# Patient Record
Sex: Female | Born: 1935 | Race: White | Hispanic: No
Health system: Southern US, Community
[De-identification: ages and names within clinical notes are randomized; demographics above are authoritative.]

---

## 2003-12-14 ENCOUNTER — Other Ambulatory Visit: Payer: Self-pay

## 2004-08-05 ENCOUNTER — Ambulatory Visit: Payer: Self-pay | Admitting: Oncology

## 2004-09-05 ENCOUNTER — Ambulatory Visit: Payer: Self-pay | Admitting: Oncology

## 2004-10-05 ENCOUNTER — Ambulatory Visit: Payer: Self-pay | Admitting: Oncology

## 2004-11-07 ENCOUNTER — Ambulatory Visit: Payer: Self-pay | Admitting: Oncology

## 2004-12-06 ENCOUNTER — Ambulatory Visit: Payer: Self-pay | Admitting: Oncology

## 2005-01-03 ENCOUNTER — Ambulatory Visit: Payer: Self-pay | Admitting: Oncology

## 2005-02-03 ENCOUNTER — Ambulatory Visit: Payer: Self-pay | Admitting: Oncology

## 2005-03-05 ENCOUNTER — Ambulatory Visit: Payer: Self-pay | Admitting: Oncology

## 2005-03-23 ENCOUNTER — Ambulatory Visit: Payer: Self-pay | Admitting: Oncology

## 2005-04-05 ENCOUNTER — Ambulatory Visit: Payer: Self-pay | Admitting: Oncology

## 2005-05-05 ENCOUNTER — Ambulatory Visit: Payer: Self-pay | Admitting: Oncology

## 2005-06-05 ENCOUNTER — Ambulatory Visit: Payer: Self-pay | Admitting: Oncology

## 2005-07-06 ENCOUNTER — Ambulatory Visit: Payer: Self-pay | Admitting: Oncology

## 2005-08-05 ENCOUNTER — Ambulatory Visit: Payer: Self-pay | Admitting: Oncology

## 2005-09-05 ENCOUNTER — Ambulatory Visit: Payer: Self-pay | Admitting: Oncology

## 2005-10-05 ENCOUNTER — Ambulatory Visit: Payer: Self-pay | Admitting: Oncology

## 2005-11-05 ENCOUNTER — Ambulatory Visit: Payer: Self-pay | Admitting: Oncology

## 2005-12-06 ENCOUNTER — Ambulatory Visit: Payer: Self-pay | Admitting: Oncology

## 2006-01-03 ENCOUNTER — Ambulatory Visit: Payer: Self-pay | Admitting: Oncology

## 2006-02-03 ENCOUNTER — Ambulatory Visit: Payer: Self-pay | Admitting: Oncology

## 2006-02-15 ENCOUNTER — Ambulatory Visit: Payer: Self-pay | Admitting: Oncology

## 2006-03-05 ENCOUNTER — Ambulatory Visit: Payer: Self-pay | Admitting: Oncology

## 2006-04-05 ENCOUNTER — Ambulatory Visit: Payer: Self-pay | Admitting: Oncology

## 2006-05-05 ENCOUNTER — Ambulatory Visit: Payer: Self-pay | Admitting: Oncology

## 2006-06-05 ENCOUNTER — Ambulatory Visit: Payer: Self-pay | Admitting: Oncology

## 2006-06-25 ENCOUNTER — Ambulatory Visit: Payer: Self-pay | Admitting: Ophthalmology

## 2006-07-06 ENCOUNTER — Ambulatory Visit: Payer: Self-pay | Admitting: Oncology

## 2006-08-05 ENCOUNTER — Ambulatory Visit: Payer: Self-pay | Admitting: Oncology

## 2006-09-05 ENCOUNTER — Ambulatory Visit: Payer: Self-pay | Admitting: Oncology

## 2006-10-05 ENCOUNTER — Ambulatory Visit: Payer: Self-pay | Admitting: Oncology

## 2006-11-05 ENCOUNTER — Ambulatory Visit: Payer: Self-pay | Admitting: Oncology

## 2006-12-06 ENCOUNTER — Ambulatory Visit: Payer: Self-pay | Admitting: Oncology

## 2007-01-04 ENCOUNTER — Ambulatory Visit: Payer: Self-pay | Admitting: Oncology

## 2007-01-07 ENCOUNTER — Ambulatory Visit: Payer: Self-pay | Admitting: Oncology

## 2007-02-04 ENCOUNTER — Ambulatory Visit: Payer: Self-pay | Admitting: Oncology

## 2007-03-06 ENCOUNTER — Ambulatory Visit: Payer: Self-pay | Admitting: Oncology

## 2007-04-06 ENCOUNTER — Ambulatory Visit: Payer: Self-pay | Admitting: Oncology

## 2007-05-06 ENCOUNTER — Ambulatory Visit: Payer: Self-pay | Admitting: Oncology

## 2007-06-06 ENCOUNTER — Ambulatory Visit: Payer: Self-pay | Admitting: Oncology

## 2007-06-13 ENCOUNTER — Ambulatory Visit: Payer: Self-pay | Admitting: Oncology

## 2007-07-01 ENCOUNTER — Ambulatory Visit: Payer: Self-pay | Admitting: Oncology

## 2007-07-07 ENCOUNTER — Ambulatory Visit: Payer: Self-pay | Admitting: Oncology

## 2007-08-06 ENCOUNTER — Ambulatory Visit: Payer: Self-pay | Admitting: Oncology

## 2007-09-06 ENCOUNTER — Ambulatory Visit: Payer: Self-pay | Admitting: Oncology

## 2007-10-06 ENCOUNTER — Ambulatory Visit: Payer: Self-pay | Admitting: Oncology

## 2007-11-06 ENCOUNTER — Ambulatory Visit: Payer: Self-pay | Admitting: Oncology

## 2007-11-25 ENCOUNTER — Ambulatory Visit: Payer: Self-pay | Admitting: Oncology

## 2007-12-07 ENCOUNTER — Ambulatory Visit: Payer: Self-pay | Admitting: Oncology

## 2007-12-26 ENCOUNTER — Ambulatory Visit: Payer: Self-pay | Admitting: Family Medicine

## 2008-01-04 ENCOUNTER — Ambulatory Visit: Payer: Self-pay | Admitting: Oncology

## 2008-02-04 ENCOUNTER — Ambulatory Visit: Payer: Self-pay | Admitting: Oncology

## 2008-04-05 ENCOUNTER — Ambulatory Visit: Payer: Self-pay | Admitting: Oncology

## 2008-04-06 ENCOUNTER — Ambulatory Visit: Payer: Self-pay | Admitting: Oncology

## 2008-05-05 ENCOUNTER — Ambulatory Visit: Payer: Self-pay | Admitting: Oncology

## 2008-06-05 ENCOUNTER — Ambulatory Visit: Payer: Self-pay | Admitting: Oncology

## 2008-07-06 ENCOUNTER — Ambulatory Visit: Payer: Self-pay | Admitting: Oncology

## 2008-08-05 ENCOUNTER — Ambulatory Visit: Payer: Self-pay | Admitting: Oncology

## 2008-09-05 ENCOUNTER — Ambulatory Visit: Payer: Self-pay | Admitting: Oncology

## 2008-09-21 ENCOUNTER — Ambulatory Visit: Payer: Self-pay | Admitting: Oncology

## 2008-10-05 ENCOUNTER — Ambulatory Visit: Payer: Self-pay | Admitting: Oncology

## 2008-12-14 ENCOUNTER — Ambulatory Visit: Payer: Self-pay | Admitting: Oncology

## 2009-01-03 ENCOUNTER — Ambulatory Visit: Payer: Self-pay | Admitting: Oncology

## 2009-01-22 ENCOUNTER — Emergency Department: Payer: Self-pay | Admitting: Emergency Medicine

## 2009-02-07 ENCOUNTER — Ambulatory Visit: Payer: Self-pay | Admitting: Oncology

## 2009-05-05 ENCOUNTER — Ambulatory Visit: Payer: Self-pay | Admitting: Oncology

## 2009-05-18 ENCOUNTER — Ambulatory Visit: Payer: Self-pay | Admitting: Oncology

## 2009-05-31 ENCOUNTER — Ambulatory Visit: Payer: Self-pay | Admitting: Oncology

## 2009-06-05 ENCOUNTER — Ambulatory Visit: Payer: Self-pay | Admitting: Oncology

## 2009-08-05 ENCOUNTER — Ambulatory Visit: Payer: Self-pay | Admitting: Oncology

## 2009-08-31 ENCOUNTER — Inpatient Hospital Stay: Payer: Self-pay | Admitting: Internal Medicine

## 2009-09-05 ENCOUNTER — Ambulatory Visit: Payer: Self-pay | Admitting: Oncology

## 2009-10-05 ENCOUNTER — Ambulatory Visit: Payer: Self-pay | Admitting: General Surgery

## 2009-11-05 ENCOUNTER — Ambulatory Visit: Payer: Self-pay | Admitting: Oncology

## 2009-11-29 ENCOUNTER — Ambulatory Visit: Payer: Self-pay | Admitting: Oncology

## 2009-12-06 ENCOUNTER — Ambulatory Visit: Payer: Self-pay | Admitting: Oncology

## 2010-05-05 ENCOUNTER — Ambulatory Visit: Payer: Self-pay | Admitting: Oncology

## 2010-05-26 ENCOUNTER — Ambulatory Visit: Payer: Self-pay | Admitting: Oncology

## 2010-05-30 ENCOUNTER — Ambulatory Visit: Payer: Self-pay | Admitting: Oncology

## 2010-06-05 ENCOUNTER — Ambulatory Visit: Payer: Self-pay | Admitting: Oncology

## 2010-11-15 ENCOUNTER — Ambulatory Visit: Payer: Self-pay | Admitting: Oncology

## 2010-11-16 LAB — CANCER ANTIGEN 27.29: CA 27.29: 19.2 U/mL (ref 0.0–38.6)

## 2010-12-06 ENCOUNTER — Ambulatory Visit: Payer: Self-pay | Admitting: Oncology

## 2011-05-31 ENCOUNTER — Ambulatory Visit: Payer: Self-pay | Admitting: Oncology

## 2011-06-06 ENCOUNTER — Ambulatory Visit: Payer: Self-pay | Admitting: Oncology

## 2011-06-07 LAB — CANCER ANTIGEN 27.29: CA 27.29: 23.6 U/mL (ref 0.0–38.6)

## 2011-07-07 ENCOUNTER — Ambulatory Visit: Payer: Self-pay | Admitting: Oncology

## 2011-12-28 ENCOUNTER — Ambulatory Visit: Payer: Self-pay | Admitting: Oncology

## 2011-12-28 LAB — COMPREHENSIVE METABOLIC PANEL
Alkaline Phosphatase: 49 U/L — ABNORMAL LOW (ref 50–136)
BUN: 16 mg/dL (ref 7–18)
Bilirubin,Total: 0.7 mg/dL (ref 0.2–1.0)
Co2: 29 mmol/L (ref 21–32)
Creatinine: 1.5 mg/dL — ABNORMAL HIGH (ref 0.60–1.30)
EGFR (African American): 43 — ABNORMAL LOW
EGFR (Non-African Amer.): 35 — ABNORMAL LOW
Glucose: 116 mg/dL — ABNORMAL HIGH (ref 65–99)
SGOT(AST): 26 U/L (ref 15–37)
SGPT (ALT): 22 U/L
Sodium: 139 mmol/L (ref 136–145)
Total Protein: 9.8 g/dL — ABNORMAL HIGH (ref 6.4–8.2)

## 2011-12-28 LAB — CBC CANCER CENTER
Basophil #: 0 x10 3/mm (ref 0.0–0.1)
Eosinophil #: 0.1 x10 3/mm (ref 0.0–0.7)
HCT: 36.4 % (ref 35.0–47.0)
Lymphocyte %: 36.8 %
MCV: 95.9 fL (ref 80–100)
Monocyte %: 9.8 %
Neutrophil #: 2.4 x10 3/mm (ref 1.4–6.5)
Neutrophil %: 51.6 %
Platelet: 185 x10 3/mm (ref 150–440)
RBC: 3.8 10*6/uL (ref 3.80–5.20)
RDW: 13.1 % (ref 11.5–14.5)
WBC: 4.8 x10 3/mm (ref 3.6–11.0)

## 2011-12-29 LAB — CANCER ANTIGEN 27.29: CA 27.29: 18.9 U/mL (ref 0.0–38.6)

## 2011-12-31 LAB — PROT IMMUNOELECTROPHORES(ARMC)

## 2012-01-04 ENCOUNTER — Ambulatory Visit: Payer: Self-pay | Admitting: Oncology

## 2012-01-18 ENCOUNTER — Ambulatory Visit: Payer: Self-pay | Admitting: Neurology

## 2012-04-16 IMAGING — US US EXTREM LOW BILAT COMPLETE
1 series · 17 of 25 positions shown · non-contrast
Comparison: none

REASON FOR EXAM: swelling pain eval DVT
COMMENTS:

[Series 1: us extrem low bilat complete · 17 of 85 slices shown]
[im 1/85]
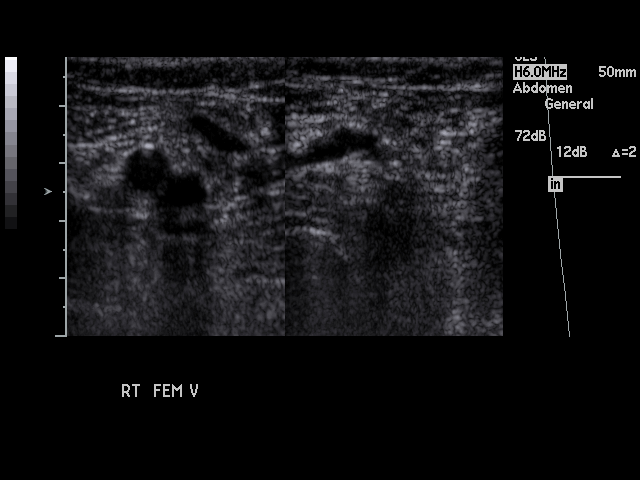
[im 8/85]
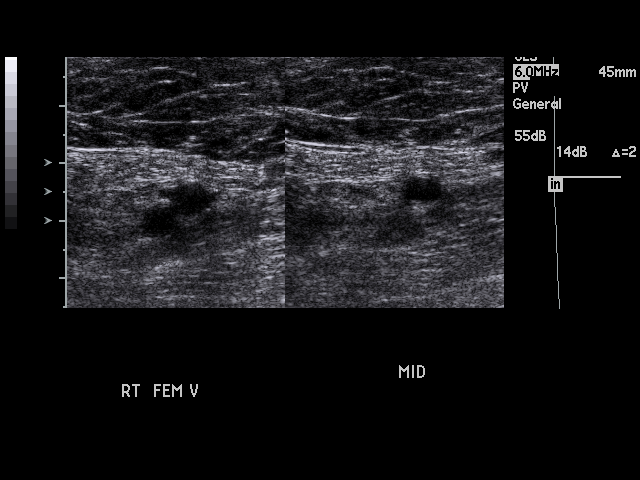
[im 11/85]
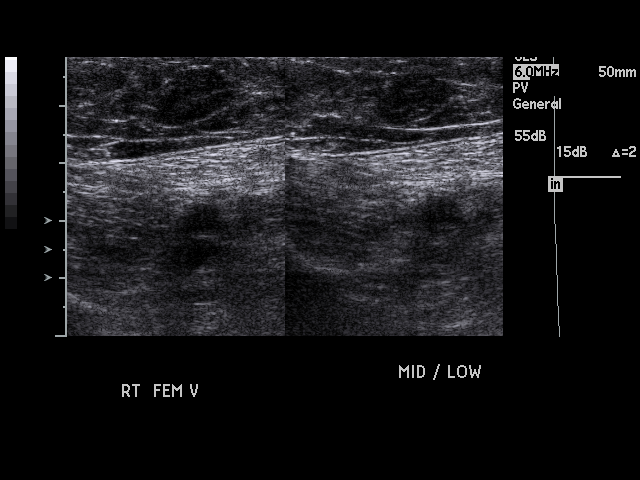
[im 18/85]
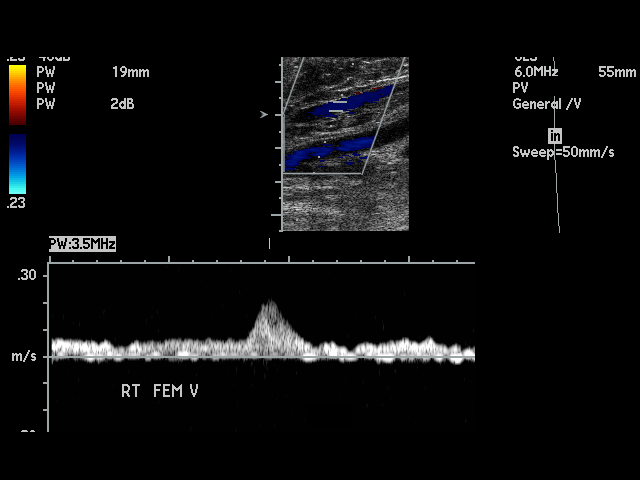
[im 22/85]
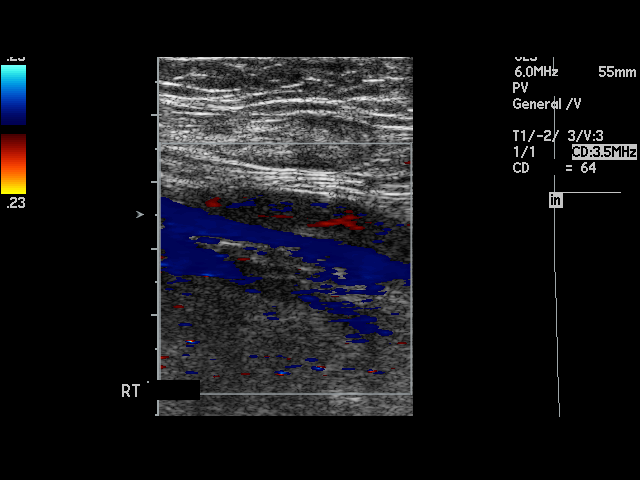
[im 29/85]
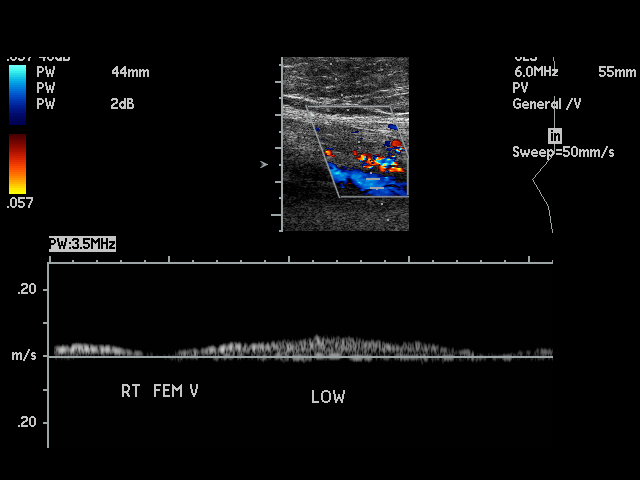
[im 32/85]
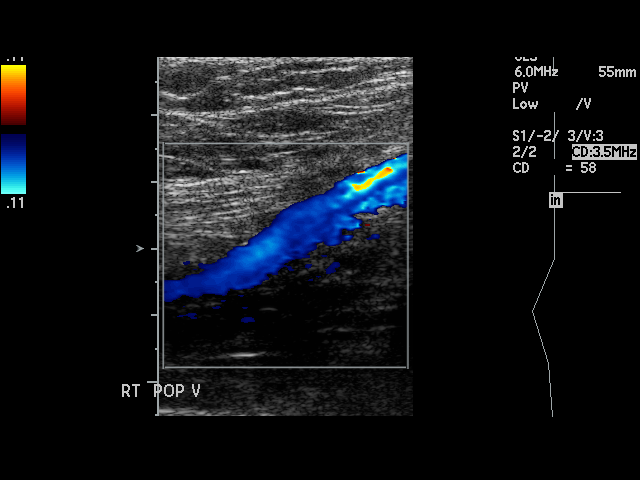
[im 39/85]
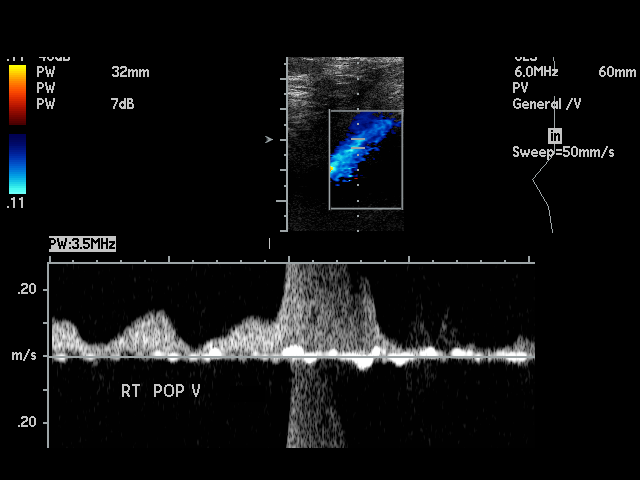
[im 43/85]
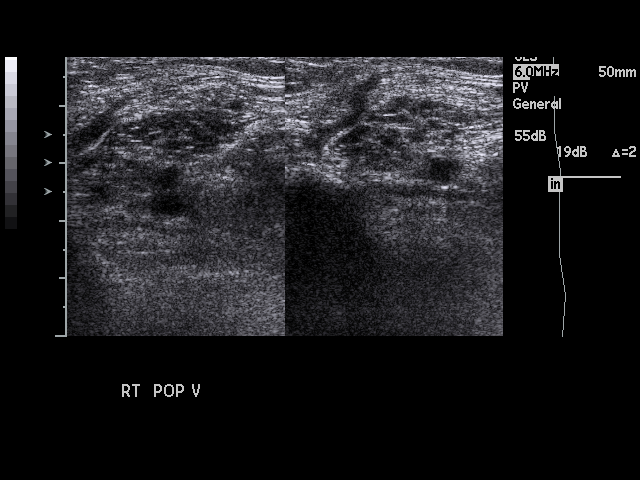
[im 46/85]
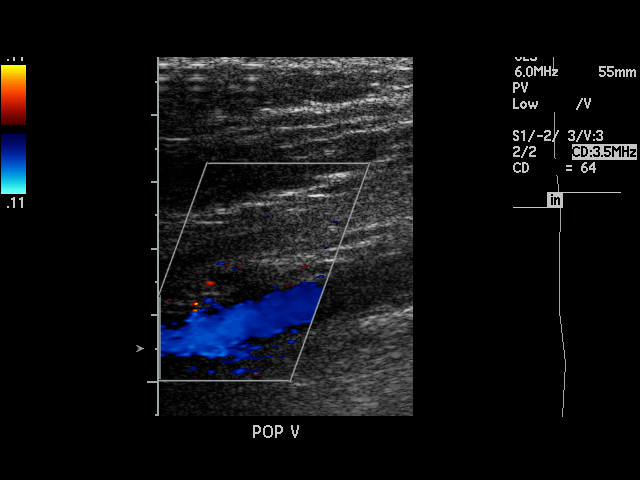
[im 53/85]
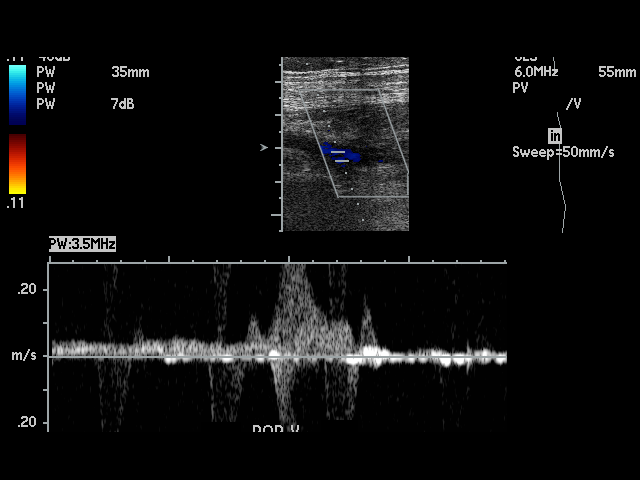
[im 57/85]
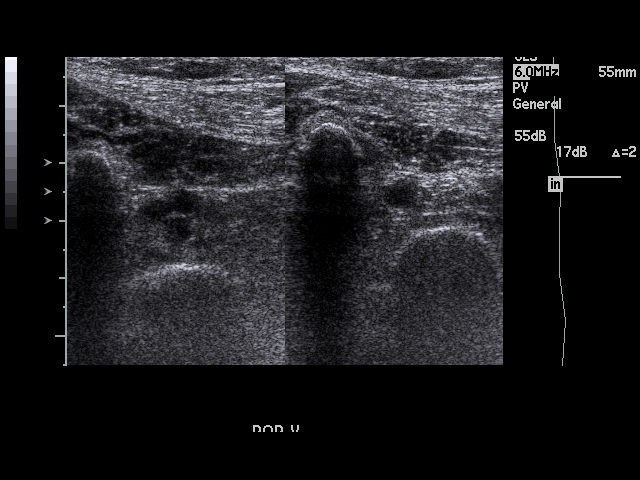
[im 64/85]
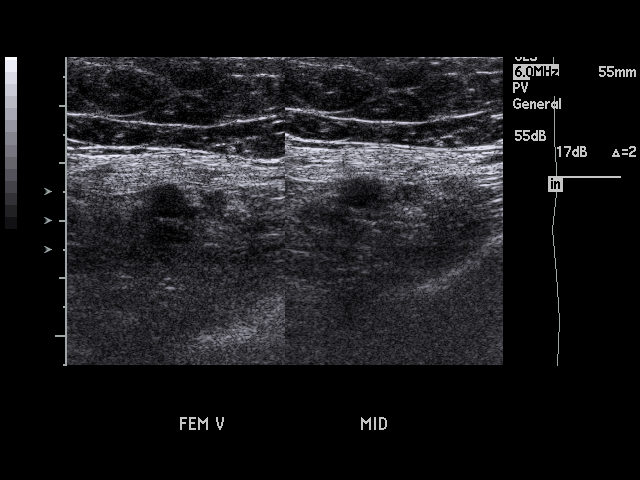
[im 67/85]
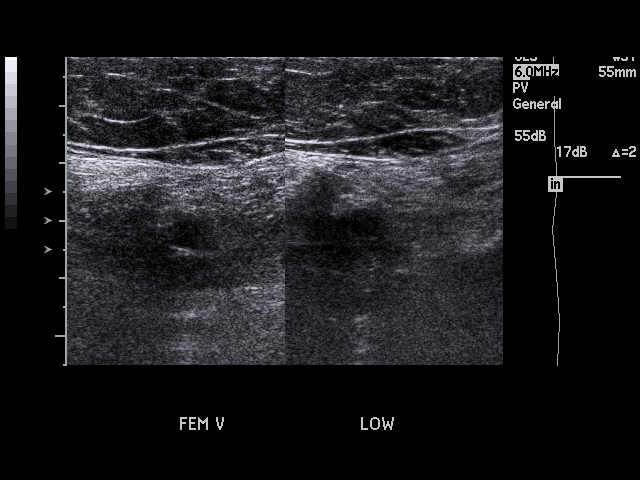
[im 74/85]
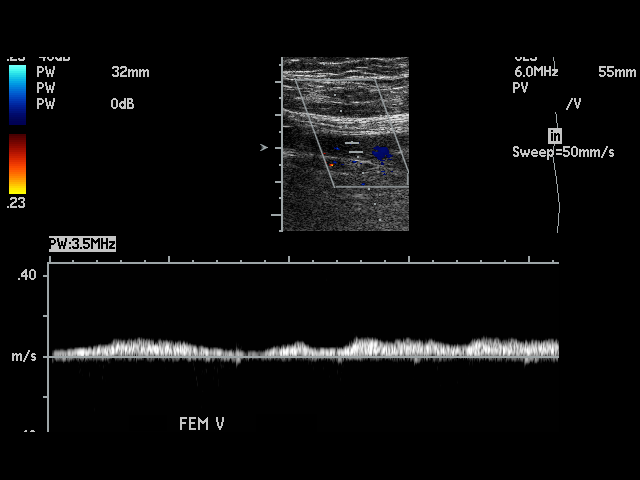
[im 78/85]
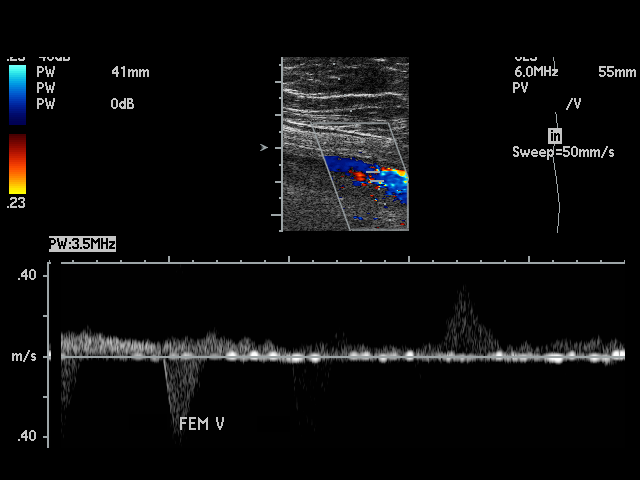
[im 85/85]
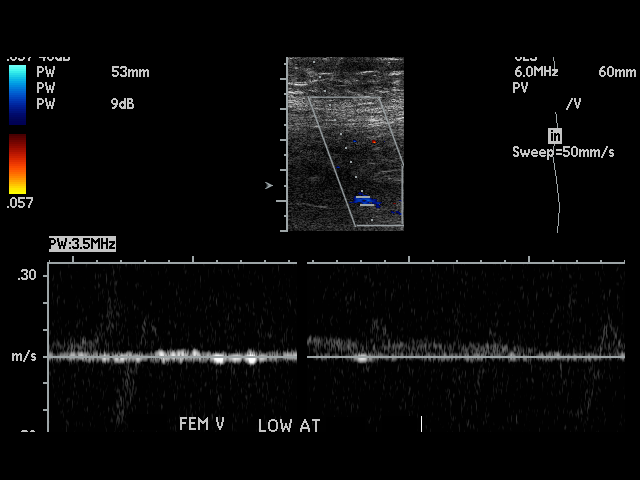

[17 of 25 positions shown; findings below may reference images not displayed]

PROCEDURE:     REDDI - REDDI DOPPLER LOW EXTR BILATERAL  - June 12, 2011 [DATE]

RESULT:     Duplex Doppler interrogation of the deep venous system of both
legs from the inguinal to the popliteal region demonstrates the deep venous
systems are fully compressible throughout. The color Doppler and spectral
Doppler appearance is normal. There is normal response to distal
augmentation. The color Doppler images show no filling defect.
IMPRESSION: 1. No evidence of DVT in either lower extremity.

## 2012-06-03 ENCOUNTER — Ambulatory Visit: Payer: Self-pay | Admitting: Oncology

## 2012-07-04 ENCOUNTER — Ambulatory Visit: Payer: Self-pay | Admitting: Oncology

## 2012-07-04 LAB — COMPREHENSIVE METABOLIC PANEL
Alkaline Phosphatase: 61 U/L (ref 50–136)
BUN: 21 mg/dL — ABNORMAL HIGH (ref 7–18)
Bilirubin,Total: 0.6 mg/dL (ref 0.2–1.0)
Creatinine: 1.21 mg/dL (ref 0.60–1.30)
Glucose: 117 mg/dL — ABNORMAL HIGH (ref 65–99)
SGPT (ALT): 20 U/L (ref 12–78)
Sodium: 137 mmol/L (ref 136–145)
Total Protein: 9 g/dL — ABNORMAL HIGH (ref 6.4–8.2)

## 2012-07-04 LAB — CBC CANCER CENTER
Basophil #: 0.1 x10 3/mm (ref 0.0–0.1)
Eosinophil #: 0.1 x10 3/mm (ref 0.0–0.7)
Eosinophil %: 1.4 %
HCT: 36.5 % (ref 35.0–47.0)
Lymphocyte #: 1.9 x10 3/mm (ref 1.0–3.6)
MCV: 96 fL (ref 80–100)
Monocyte %: 11 %
Neutrophil #: 3.2 x10 3/mm (ref 1.4–6.5)
Neutrophil %: 54.9 %
Platelet: 201 x10 3/mm (ref 150–440)
RBC: 3.81 10*6/uL (ref 3.80–5.20)
WBC: 5.8 x10 3/mm (ref 3.6–11.0)

## 2012-07-06 ENCOUNTER — Ambulatory Visit: Payer: Self-pay | Admitting: Oncology

## 2014-09-14 ENCOUNTER — Ambulatory Visit: Payer: Self-pay | Admitting: Emergency Medicine

## 2014-09-14 LAB — COMPREHENSIVE METABOLIC PANEL
Albumin: 3.2 g/dL — ABNORMAL LOW (ref 3.4–5.0)
Alkaline Phosphatase: 95 U/L
Anion Gap: 8 (ref 7–16)
BUN: 28 mg/dL — ABNORMAL HIGH (ref 7–18)
Bilirubin,Total: 0.7 mg/dL (ref 0.2–1.0)
Calcium, Total: 9.1 mg/dL (ref 8.5–10.1)
Chloride: 101 mmol/L (ref 98–107)
Co2: 24 mmol/L (ref 21–32)
Creatinine: 1.8 mg/dL — ABNORMAL HIGH (ref 0.60–1.30)
EGFR (African American): 34 — ABNORMAL LOW
EGFR (Non-African Amer.): 28 — ABNORMAL LOW
Glucose: 80 mg/dL (ref 65–99)
Osmolality: 271 (ref 275–301)
Potassium: 5.2 mmol/L — ABNORMAL HIGH (ref 3.5–5.1)
SGOT(AST): 30 U/L (ref 15–37)
SGPT (ALT): 25 U/L
Sodium: 133 mmol/L — ABNORMAL LOW (ref 136–145)
Total Protein: 10.5 g/dL — ABNORMAL HIGH (ref 6.4–8.2)

## 2014-09-14 LAB — CBC WITH DIFFERENTIAL/PLATELET
Basophil #: 0 10*3/uL (ref 0.0–0.1)
Basophil %: 0.5 %
Eosinophil #: 0.1 10*3/uL (ref 0.0–0.7)
Eosinophil %: 1.8 %
HCT: 39.9 % (ref 35.0–47.0)
HGB: 12.9 g/dL (ref 12.0–16.0)
Lymphocyte #: 2.4 10*3/uL (ref 1.0–3.6)
Lymphocyte %: 38.1 %
MCH: 29 pg (ref 26.0–34.0)
MCHC: 32.4 g/dL (ref 32.0–36.0)
MCV: 90 fL (ref 80–100)
Monocyte #: 0.5 x10 3/mm (ref 0.2–0.9)
Monocyte %: 8.4 %
Neutrophil #: 3.2 10*3/uL (ref 1.4–6.5)
Neutrophil %: 51.2 %
Platelet: 257 10*3/uL (ref 150–440)
RBC: 4.45 10*6/uL (ref 3.80–5.20)
RDW: 16 % — ABNORMAL HIGH (ref 11.5–14.5)
WBC: 6.2 10*3/uL (ref 3.6–11.0)

## 2015-01-02 ENCOUNTER — Inpatient Hospital Stay: Payer: Self-pay | Admitting: Internal Medicine

## 2015-01-04 ENCOUNTER — Ambulatory Visit
Admit: 2015-01-04 | Disposition: A | Discharge status: Home / Self Care | Payer: Self-pay | Attending: Internal Medicine | Admitting: Internal Medicine

## 2015-02-04 ENCOUNTER — Ambulatory Visit
Admit: 2015-02-04 | Disposition: A | Discharge status: Home / Self Care | Payer: Self-pay | Attending: Internal Medicine | Admitting: Internal Medicine

## 2015-02-04 DEATH — deceased

## 2015-03-06 NOTE — Consult Note (Signed)
Psychiatry: Follow-up for this patient with dementia and agitation now admitted to the hospital for medical stabilization.  The patient was unable to provide any history or review of systems.  Nursing at bedside reports that she has been delirious and not making any sense although she has been able to drink some fluids. review of systems possible. mental status the patient is quite confused.  No idea where she is.  Tells me she is 81 years old and the year is [id number].  Affect flat and confused.  Delirious. as needed doses of antipsychotic for agitation.  No other intervention indicated at this point.  We will follow as needed.  Electronic Signatures: Cydne Grahn, Jackquline DenmarkJohn T (MD)  (Signed on 29-Feb-16 15:14)  Authored  Last Updated: 29-Feb-16 15:14 by Audery Amellapacs, Milanna Kozlov T (MD)

## 2015-03-06 NOTE — Discharge Summary (Signed)
PATIENT NAME:  Maria Maynard, Maria Maynard MR#:  [id number] DATE OF BIRTH:  1925/03/02  DATE OF ADMISSION:  01/02/2015 DATE OF DISCHARGE:  01/05/2015  DISCHARGE DIAGNOSES: 1.  Acute hypoxia, probably secondary to acute bronchiolitis.  2.  Acute kidney injury.  3.  Hypothyroidism with low TSH.  4.  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   CONSULTATIONS: Pulmonology, psychiatry, palliative care.   PROCEDURES: None.   BRIEF HISTORY AND PHYSICAL AND HOSPITAL COURSE: The patient is an 81 year old female brought into the ED with shortness of breath. Please review history and physical for more details. The patient has history of Alzheimer dementia,brought into the hospital on 12/30/2014 for aggressive behavior given her dementia. On 01/02/2015 with this current admission, the patient was found to be hypoxemic with pulse oximetry at 70% on room air.   HOSPITAL COURSE BASED ON THE PROBLEM: Acute  Hypoxia :  probably from acute bronchiolitis per CT chest with underlying pleural metastasis and probably mucus plugging. The patient was placed on nonrebreather and we could not wean him off. The patient was seen by pulmonology who has recommended to transfer the patient to ICU, as the patient is still short of breath and has very low threshold for intubation. This was discussed with the family members, including son who is the medical power of attorney. After explaining the clinical situation, son has requested only comfort care measures with hospice. The patient was given antibiotics, IV Rocephin and Zithromax, but as per the patient's son's request his IV antibiotics were continued and palliative care consult was placed. CAT scan of the chest did not reveal any pulmonary embolus. The patient's nonrebreather was discontinued and the patient was placed on oxygen via nasal cannula. Strict comfort care measures were implemented. Subsequently, the patient was then transferred to hospice home,  after seen and evaluated by palliative care on March 2.   Acute kidney injury, resolved with IV fluids.   Hypothyroidism with low TSH. Free T4 was elevated. Decreased home dose Synthroid to 37.5 mcg from 50 mcg.   Alzheimer dementia with aggressive behavior. Psychiatric consult was placed, but even before psychiatry has evaluated her, the patient was made comfort care and she was transferred to hospice home on 01/05/2015.   CODE STATUS AT THE TIME OF TRANSFER: DNR with comfort care measures.   DISCHARGE PHYSICAL EXAMINATION: VITAL SIGNS: On March 2 were not recorded as the patient was made comfort care. On March 1, pulse 76, respirations 18, blood pressure 147/80 with pulse oximetry 95%.   GENERAL APPEARANCE: The patient was found to be comfortable and he was on comfort care measures, pink conjunctivae.  NECK: Supple.  RESPIRATORY: Using accessory muscles. Positive rhonchi and crackles.  GASTROINTESTINAL: Soft. Normal bowel sounds.  EXTREMITIES: There is no cyanosis, clubbing.  SKIN: With no rashes.  PSYCHIATRIC: The patient was found to be lethargic.   LABORATORY AND IMAGING STUDIES: February 29: Glucose 95, BUN 24, creatinine 1.04. Sodium and potassium were normal. Chloride 110, CO2 of 24. Anion gap 7. Serum osmolality and calcium were normal. GFR greater than 60. WBC 8.3, hemoglobin 11.3, hematocrit 34.7, platelets are 154,000.   MEDICATIONS AT THE TIME OF TRANSFER: Levothyroxine 50 mcg 1 tablet p.o. once daily, lorazepam 0.5 mg 1 to 2 tablets p.o. every 2 to 4 hours as needed for agitation or anxiety, morphine 20 mg/mL 0.25 mL p.o. every 1 to 2 hours for comfort.  DIET: As tolerated.   ACTIVITY: As tolerated.   OXYGEN: As needed for comfort.  FOLEY: Also, will leave Foley catheter for urinary incontinence and to prevent skin breakdown.  The plan of care was discussed in detail with the patient's son, who is the medical power of attorney. He is aware of the plan and all his  questions were answered.   TOTAL TIME SPENT ON THE DISCHARGE AND COORDINATION OF CARE: Forty-five minutes.    ____________________________ Ramonita LabAruna Elyanna Wallick, MD ag:TM D: 01/09/2015 23:02:00 ET T: 01/10/2015 00:32:29 ET JOB#: [id number]  cc: Ramonita LabAruna Sheray Grist, MD, <Dictator> Ramonita LabARUNA Seanne Chirico MD ELECTRONICALLY SIGNED 01/18/2015 20:46

## 2015-03-06 NOTE — Consult Note (Signed)
PATIENT NAME:  Maria PickleMITCHELL, Peightyn N MR#:  [id number] DATE OF BIRTH:  Nov 16, 1924  DATE OF CONSULTATION:  01/02/2015  REFERRING PHYSICIAN:   CONSULTING PHYSICIAN:  Audyn Dimercurio K. Guss Bundehalla, MD  SUBJECTIVE: Staff reports that the patient was agitated in the Emergency Room, and they were concerned and asked for a psychiatry consult. The patient has a long history of dementia and has been living at a facility. According to the nurse that is following her, the patient had been calm and cooperative and she is on the mask with oxygen going on and oxygen is 95% saturated and she is much calmer, cooperative.  OBJECTIVE: The patient was seen lying in bed with oxygen going on. Alert but is very confused. Rambling speech, did not make much sense and it is mostly nonsensical. When nurse was pointed out and she was asked who she is, she said "It is a thing." Does not make much sense. Insight, judgment impaired.  IMPRESSION: Major neuro cognitive disorder - Alzheimer dementia with confusion.  RECOMMENDATION: Continue current treatment. Consider Ativan 1 mg p.o. or IM q. 6 hours p.r.n. for agitation and Haldol 1 mg p.o. or IM q. 6 hours for p.r.n. agitation.   ____________________________ Jannet MantisSurya K. Guss Bundehalla, MD skc:ST D: 01/02/2015 19:11:11 ET T: 01/03/2015 01:53:32 ET JOB#: [id number]  cc: Monika SalkSurya K. Guss Bundehalla, MD, <Dictator> Beau FannySURYA K Henderson Frampton MD ELECTRONICALLY SIGNED 01/03/2015 7:47

## 2015-03-06 NOTE — H&P (Signed)
PATIENT NAME:  Maria Maynard, Maria Maynard MR#:  [id number] DATE OF BIRTH:  1924-12-01  DATE OF ADMISSION:  01/02/2015  REFERRING PHYSICIAN: Coolidge Breeze, MD    PRIMARY CARE PHYSICIAN: Mickie Hillier. Sheppard Penton, MD   CHIEF COMPLAINT: Shortness of breath.   HISTORY OF PRESENT ILLNESS: This is an 81 year old Caucasian female with past medical history of breast cancer with known pleural metastases; essential hypertension, hypothyroidism, unspecified; as well as Alzheimer dementia who was originally brought to the hospital on 12/30/2014 for aggressive behavior given her dementia. She was then evaluated in the Emergency Department throughout that time period by both psychiatry and case management, essentially working on finding her an appropriate place to reside, as the family states they are no longer able to care for her. Today she had an acute episode where she became hypoxic, desaturates into the high 70s on room air. Had improvement of her oxygen saturation with some supplemental oxygen. No further symptomatology noted. The patient herself unable to provide any meaningful information given mental status and medical condition. Her mental status is at baseline, per the Emergency Room staff. Family members were unable to be reached at this time through the given contact number of [phone].    REVIEW OF SYSTEMS: Unobtainable given the patient's mental status and medical condition.   PAST MEDICAL HISTORY: Includes breast cancer with known pleural metastases; as well as essential hypertension; hypothyroidism, unspecified; Alzheimer dementia with aggressive behavior.   SOCIAL HISTORY: Unobtainable given the patient's mental status and medical condition. Per documentation, currently resides with her son. No history of alcohol, tobacco, or drug usage.   FAMILY HISTORY: Unobtainable, given the patient's mental status and medical condition.   ALLERGIES: No known drug allergies.   HOME MEDICATIONS: Include metoprolol  50 mg p.o. b.i.d., rivastigmine 9.5 mg 24-hour  transdermal film 1/2 patch daily, melatonin 10 mg p.o. at bedtime, levothyroxine 50 mcg p.o. daily.   PHYSICAL EXAMINATION:  VITAL SIGNS: Temperature 99.2, heart rate of 111, respirations 20, blood pressure 127/60, currently saturating 97% on nonrebreather.  GENERAL: Weak, frail-appearing Caucasian female currently in minimal distress given respiratory status.  HEAD: Normocephalic, atraumatic.  EYES: Pupils equal, round, react to light. Unable to fully assess whether extraocular muscle intact. No scleral icterus.  MOUTH: Dry mucosal membrane. Dentition intact. No abscess noted.  EAR, NOSE, AND THROAT: Clear without exudates. No external lesions.  NECK: Supple. No thyromegaly. No nodules. No JVD.  PULMONARY: Diminished breath sounds secondary to poor respiratory effort with some coarse breath sounds throughout the right lung fields; however, no frank wheezes, rales, or rhonchi at this time. No use of accessory muscles. Poor respiratory effort, as stated above.  CHEST: Nontender to palpation.  CARDIOVASCULAR: S1, S2, tachycardic. No murmurs, rubs, or gallops. No edema. Pedal pulses 2+ bilaterally.  GASTROINTESTINAL: Soft, nontender, nondistended. No masses. Positive bowel sounds. No hepatosplenomegaly.  MUSCULOSKELETAL: No swelling, clubbing, or edema. Range of motion full in all extremities.  NEUROLOGIC: Cranial nerves II-XII intact. Unable to fully assess neurological status given her mental status at baseline, as she is unable to follow simple commands. However, does have spontaneous movement in all extremities.  SKIN: No ulceration, lesions, rashes, or cyanosis. Skin warm, dry. Turgor intact.  PSYCHIATRIC: Unable to fully assess given the patient's mental status and medical condition, as she is unable to follow simple commands or provide any meaningful information.   LABORATORY DATA: Sodium of 140, potassium 4, chloride 105, bicarbonate of 25, BUN  20, creatinine 1.32, glucose of 163. Troponin  less than 0.02. TSH of 0.046. WBC of 10.4, hemoglobin 13.4, platelets of 200,000. Urinalysis negative for evidence of infection. Chest x-ray performed, which reveals no active cardiopulmonary process. CT chest performed to rule out pulmonary embolus is negative for PE; however, does reveal bronchial thickening and findings consistent with bronchiolitis in the lower lobes and right middle lobe.   ASSESSMENT AND PLAN: An 81 year old Caucasian female with a history of dementia, as well as known breast cancer with pleural metastases who originally presented for behavior disturbances related to her Alzheimer dementia; however, throughout the course of her Emergency Department stay has developed a new onset hypoxia with increased oxygen demands.  1.  Hypoxia: No evidence of pulmonary embolus. CT findings suggestive of bronchiolitis. We will provide supplemental oxygen to keep oxygen saturation greater than 92%. DuoNeb treatments q. 4 hours as required for shortness of breath. We will initiate antibiotic coverage with azithromycin and ceftriaxone. Cultures have been obtained from the Emergency Room.  2.  Acute kidney injury: Intravenous fluid hydration. Follow urine output and renal function.  3.  Hypothyroidism with low TSH: We will check a free T4. Continue with home dosages of Synthroid.  4.  Alzheimer dementia with aggressive behavior: We will consult psychiatry as well as social work, as they were already following in the Emergency Room and continue with the search for placement.  5.  Venous thromboembolism prophylaxis: Heparin.   CODE STATUS: Unable to reach the family at this time at the given the numbers to determine code status.    TIME SPENT: 45 minutes.    ____________________________ Cletis Athensavid K. Hower, MD dkh:bm D: 01/02/2015 00:38:09 ET T: 01/02/2015 01:04:15 ET JOB#: [id number]  cc: Cletis Athensavid K. Hower, MD, <Dictator> DAVID Synetta ShadowK HOWER MD ELECTRONICALLY  SIGNED 01/03/2015 4:19

## 2015-03-06 NOTE — Consult Note (Signed)
PATIENT NAME:  Maria Maynard, [place] N MR#:  [id number] DATE OF BIRTH:  [birth year]  DATE OF CONSULTATION:  12/31/2014  REFERRING PHYSICIAN:   CONSULTING PHYSICIAN:  Audery AmelJohn T. Harshith Pursell, MD  IDENTIFYING INFORMATION AND REASON FOR CONSULTATION: An 81 year old woman with multiple medical problems, brought to the hospital by her family because of worsening behavior.   HISTORY OF PRESENT ILLNESS: Information obtained primarily from the chart and evaluation directly of the patient when possible. This is an 81 year old woman who was brought in by her family who stated that for about the last week and a half she has been more aggressive and combative with hitting, kicking and slapping at people. They state that these behaviors have happened in the past, but they seem to be getting worse. Report that it has been getting constant, one thing after another. It is not really clear that there has been any acute exacerbation here rather just a chronic problem that may have worsened a little bit to where they were fed up with it.  No indication that I can see of any new or different medicines. The patient is unable to give any information at all.   PAST PSYCHIATRIC HISTORY: Evidently has an established history of dementia. Had been in facilities in the past but had gotten thrown out for her aggressive behavior. No other known past psychiatric history.   SOCIAL HISTORY: Evidently had been living with her family recently. Again, the patient is not able to give information, sounds like she was living with her son.   PAST MEDICAL HISTORY: The patient suffers from breast cancer, but appears to be in long-term remission. History of hypothyroidism and high blood pressure as well.   FAMILY HISTORY: Unknown.   SUBSTANCE ABUSE HISTORY: Unknown.   REVIEW OF SYSTEMS: The patient is not able to offer any review of systems.   MENTAL STATUS EXAMINATION: Very elderly woman, looks her stated age, evaluated in the Emergency Room. The  patient was asleep, but would wake up with speaking her name. Eyes would open but she did not make direct eye contact. Psychomotor activity very slow and limited. Speech is quiet, often mumbled or whispered. Much decreased in amount. Affect is flat. Thoughts are disorganized. The patient clearly believes that she is somewhere else, is talking to people who are not there. Behavior does not show any aggression at this point, but she has been given medication in the Emergency Room. Not able to reasonably ask questions about suicidal or homicidal ideation, but not showing any acute aggressive behavior. The patient is not able to complete any kind of cognitive testing really. When I asked her where she was, she thought that she was in a garden place, also said that she was 81 years old. I did not pursue further cognitive evaluation.   LABORATORY RESULTS: Head CT was done and showed old infarct, chronic shrinking, but it did not sound like there was any change from the last exams that were done. Nothing to indicate any new mass. TSH has been checked and is very low at 0.046. Chemistry panel: Elevated AST at 39, elevated protein 9.8, otherwise pretty unremarkable. Alcohol, acetaminophen, and salicylates negative. CBC unremarkable. The urinalysis shows 1+ bacteria, 6 epithelial cells, 7 white cells, 2 red blood cells, trace leukocyte esterase. Drug screen positive for benzodiazepines.   CURRENT MEDICATIONS: Metoprolol 50 mg twice a day, levothyroxine 50 mcg 1 tablet once a day, melatonin 10 mg once a day, rivastigmine (Exelon) patch daily.   ALLERGIES: No known drug  allergies.   ASSESSMENT: An 81 year old woman who currently is delirious. Could have severely advanced dementia that could be complicated by medical issues. I discussed her urinalysis with the Emergency Room doctor who is of the opinion that it is unlikely to be a causative factor, but agrees to try antibiotics to see if that would improve her mental  state. The patient has been given some sedating medicine here in the Emergency Room and is not showing any aggression right now. No other obvious acute medical problem to cause this. Most likely primarily Alzheimer disease and vascular dementia with progressive behavior problems.   TREATMENT PLAN: The psychiatry staff will discuss the diagnosis and treatment plan with the family and encourage them to take her back home as this is unlikely to really need psychiatric hospitalization. I can start her on a very low dose of Risperdal 0.25 mg twice a day as needed for agitation. She will need to then follow up with a primary care doctor. If the family refuses to take her home we will have to step back and re-evaluate what can be done.   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   SECONDARY DIAGNOSES: AXIS I: No further.  AXIS II: Deferred.  AXIS III: History of breast cancer, hypertension, and hypothyroid with possible over medication right now. Rule out urinary tract infection.   ____________________________ Audery Amel, MD jtc:sb D: 12/31/2014 14:24:43 ET T: 12/31/2014 14:45:56 ET JOB#: [id number]  cc: Audery Amel, MD, <Dictator> Audery Amel MD ELECTRONICALLY SIGNED 01/11/2015 10:38

## 2015-11-06 IMAGING — CT CT ANGIO CHEST
2 of 6 series · 18 of 36 positions shown · IV contrast (APPLIED)
Comparison: Chest radiographs earlier this day.

CLINICAL DATA: Hypoxia, tachycardia, shortness of breath.

EXAM:
CT ANGIOGRAPHY CHEST WITH CONTRAST
TECHNIQUE: Multidetector CT imaging of the chest was performed using the
standard protocol during bolus administration of intravenous
contrast. Multiplanar CT image reconstructions and MIPs were
obtained to evaluate the vascular anatomy.
CONTRAST:  75 mL Omnipaque 350 IV.

[Series 5: pe 1.0 thins · axial · 0.64mm/px · z∈[-248,-8]mm · 17 of 271 slices shown]
[im 16/271  lung]
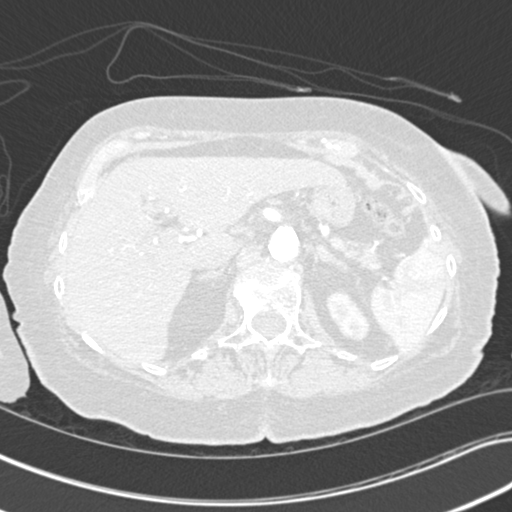
[im 31/271  mediastinal]
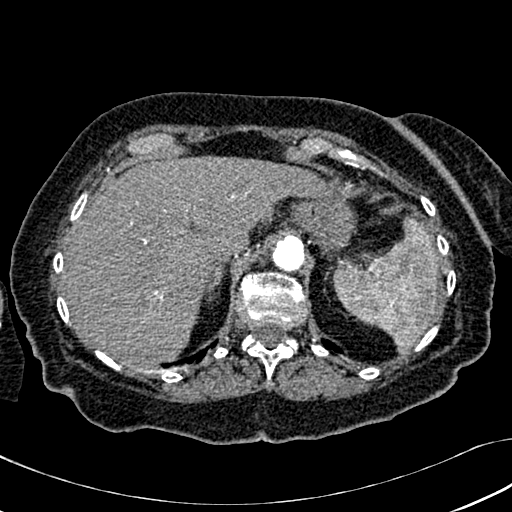
[im 46/271  lung]
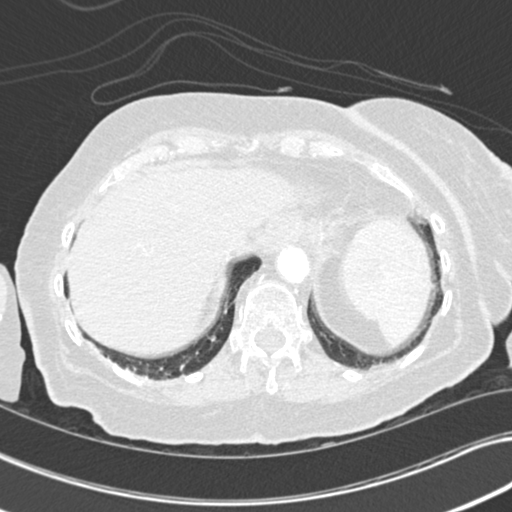
[im 61/271  mediastinal]
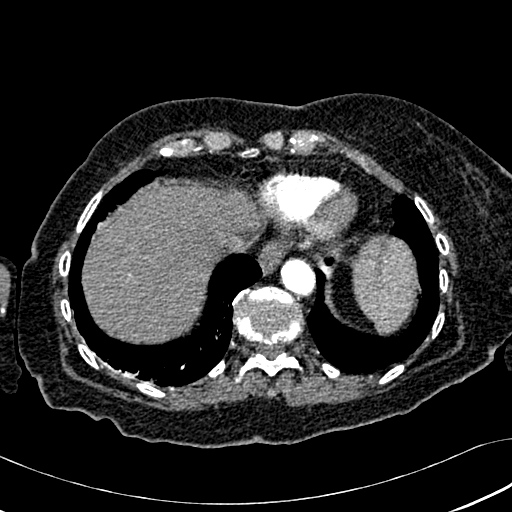
[im 76/271  lung]
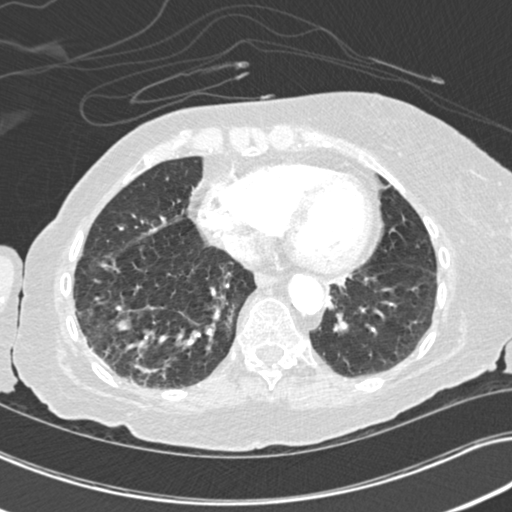
[im 91/271  mediastinal]
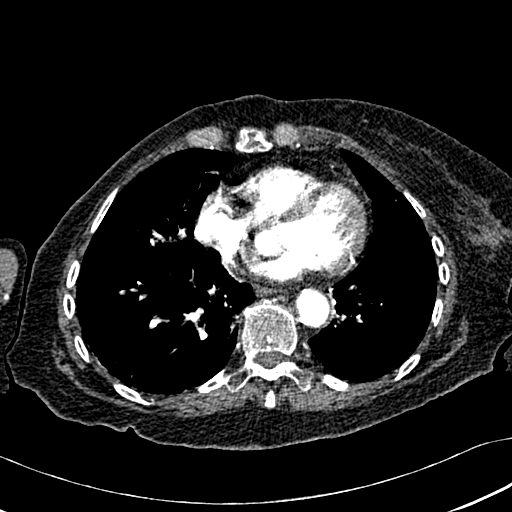
[im 106/271  lung]
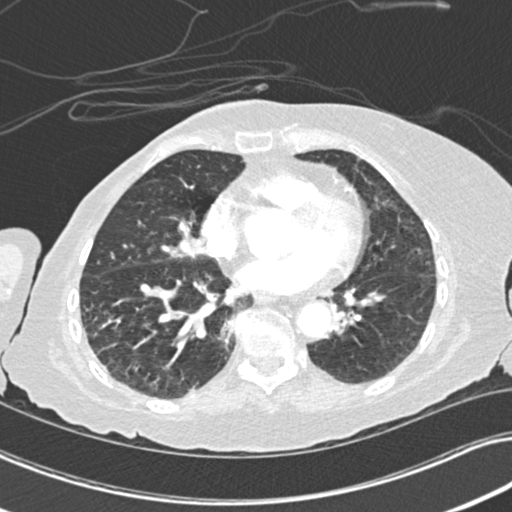
[im 121/271  mediastinal]
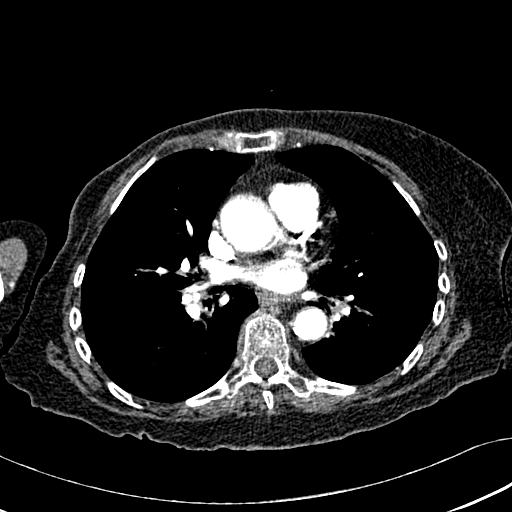
[im 136/271  lung]
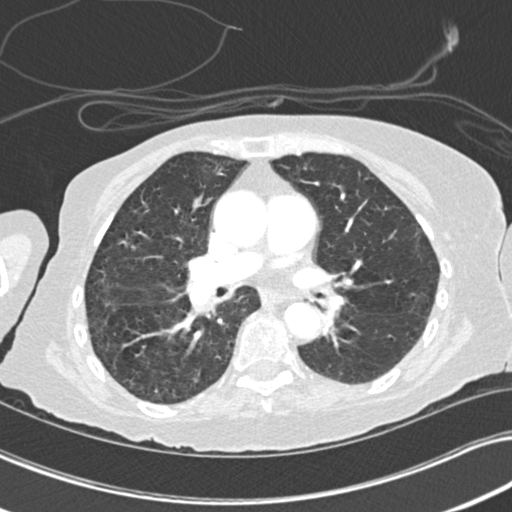
[im 151/271  mediastinal]
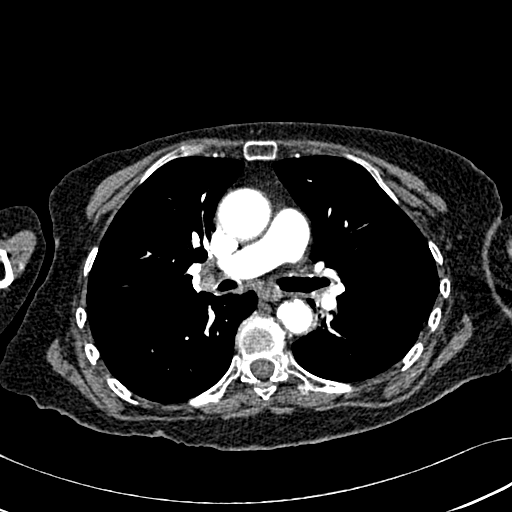
[im 166/271  lung]
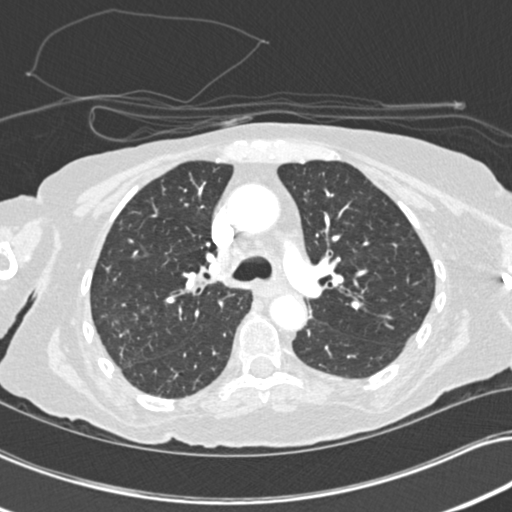
[im 181/271  mediastinal]
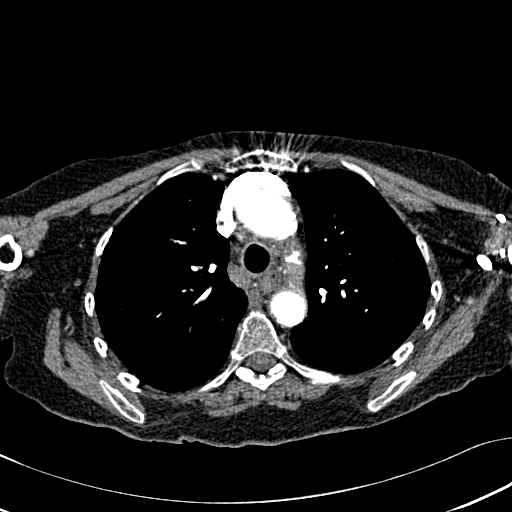
[im 196/271  lung]
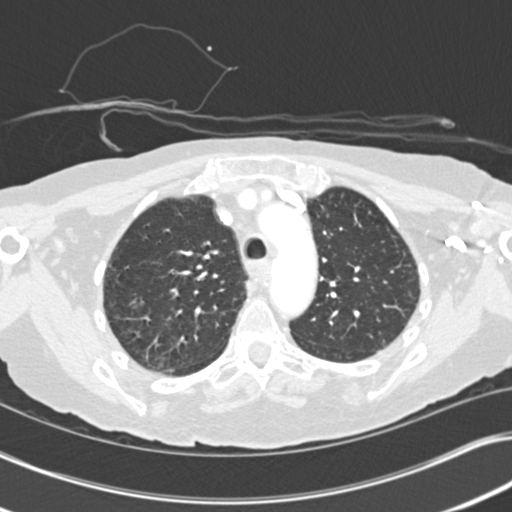
[im 211/271  mediastinal]
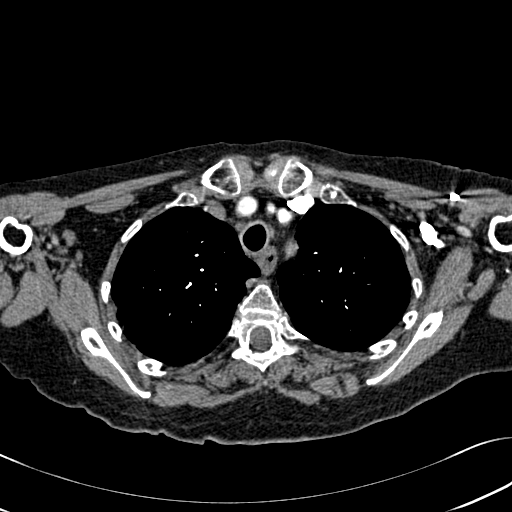
[im 226/271  lung]
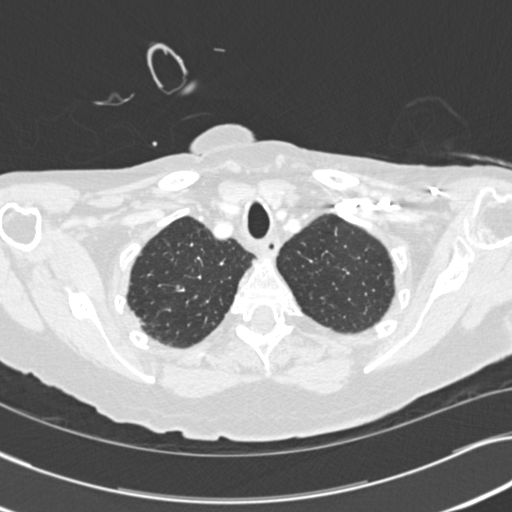
[im 241/271  mediastinal]
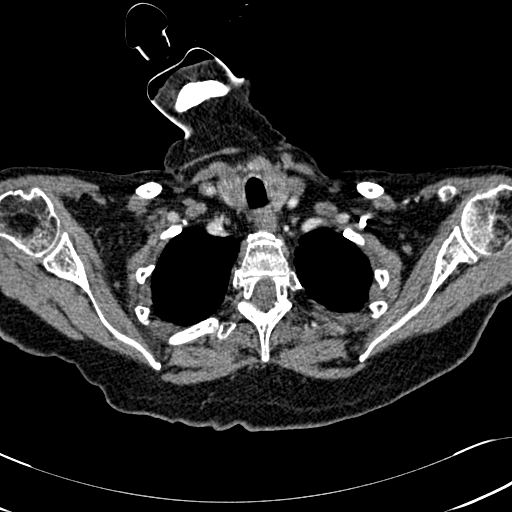
[im 256/271  lung]
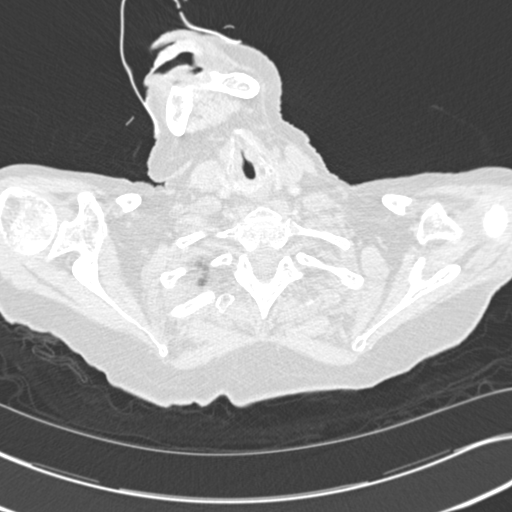

[Series 7: cor pe 2.0 mpr · coronal · 0.55mm/px · 1 of 107 slices shown]
[im 54/107  mediastinal]
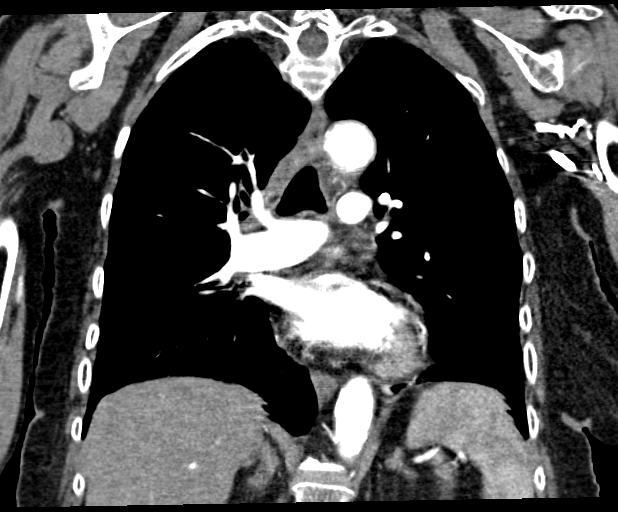

[18 of 36 positions shown; findings below may reference images not displayed]

FINDINGS: There are no filling defects within the pulmonary arteries to
suggest pulmonary embolus, allowing for motion limited exam.
Thoracic aorta is tortuous with diffuse atherosclerosis, no aneurysm
or dissection. The heart size is normal. The heart size is normal.
There are dense coronary artery calcifications. No pleural or
pericardial effusion. 11 mm right hilar lymph node is likely
reactive. No mediastinal adenopathy.

Motion artifact partially limits detailed evaluation of lung
parenchyma. There is bronchial thickening, greatest in the right
lower lobe. Mucous plugging in the right lower lobe bronchi. There
are faint tree in bud opacities in the right lower lobe, right
middle lobe and to lesser extent left lower lobe most consistent
with bronchiolitis. No confluent airspace disease. No dominant
pulmonary mass.

There is mild thickening of the midesophagus. Small hiatal hernia.
No acute abnormality in the included upper abdomen.

No acute or suspicious osseous abnormality. Minimal compression
deformity inferior endplate of L1 that appears chronic.

Review of the MIP images confirms the above findings.
IMPRESSION: 1. No pulmonary embolus.
2. Bronchial thickening and findings consistent bronchiolitis in the
lower lobes and right middle lobe.

## 2015-11-06 IMAGING — CR DG CHEST 2V
1 series · 2 of 2 positions shown · non-contrast
Comparison: 12/30/2014

CLINICAL DATA: Patient is combative. Low oxygen saturation and
fever. Not alert.

EXAM:
CHEST  2 VIEW

[Series 1: dxr chest pa (or ap) and lateral · 0.14mm/px · 2 of 2 slices shown]
[im 1/2]
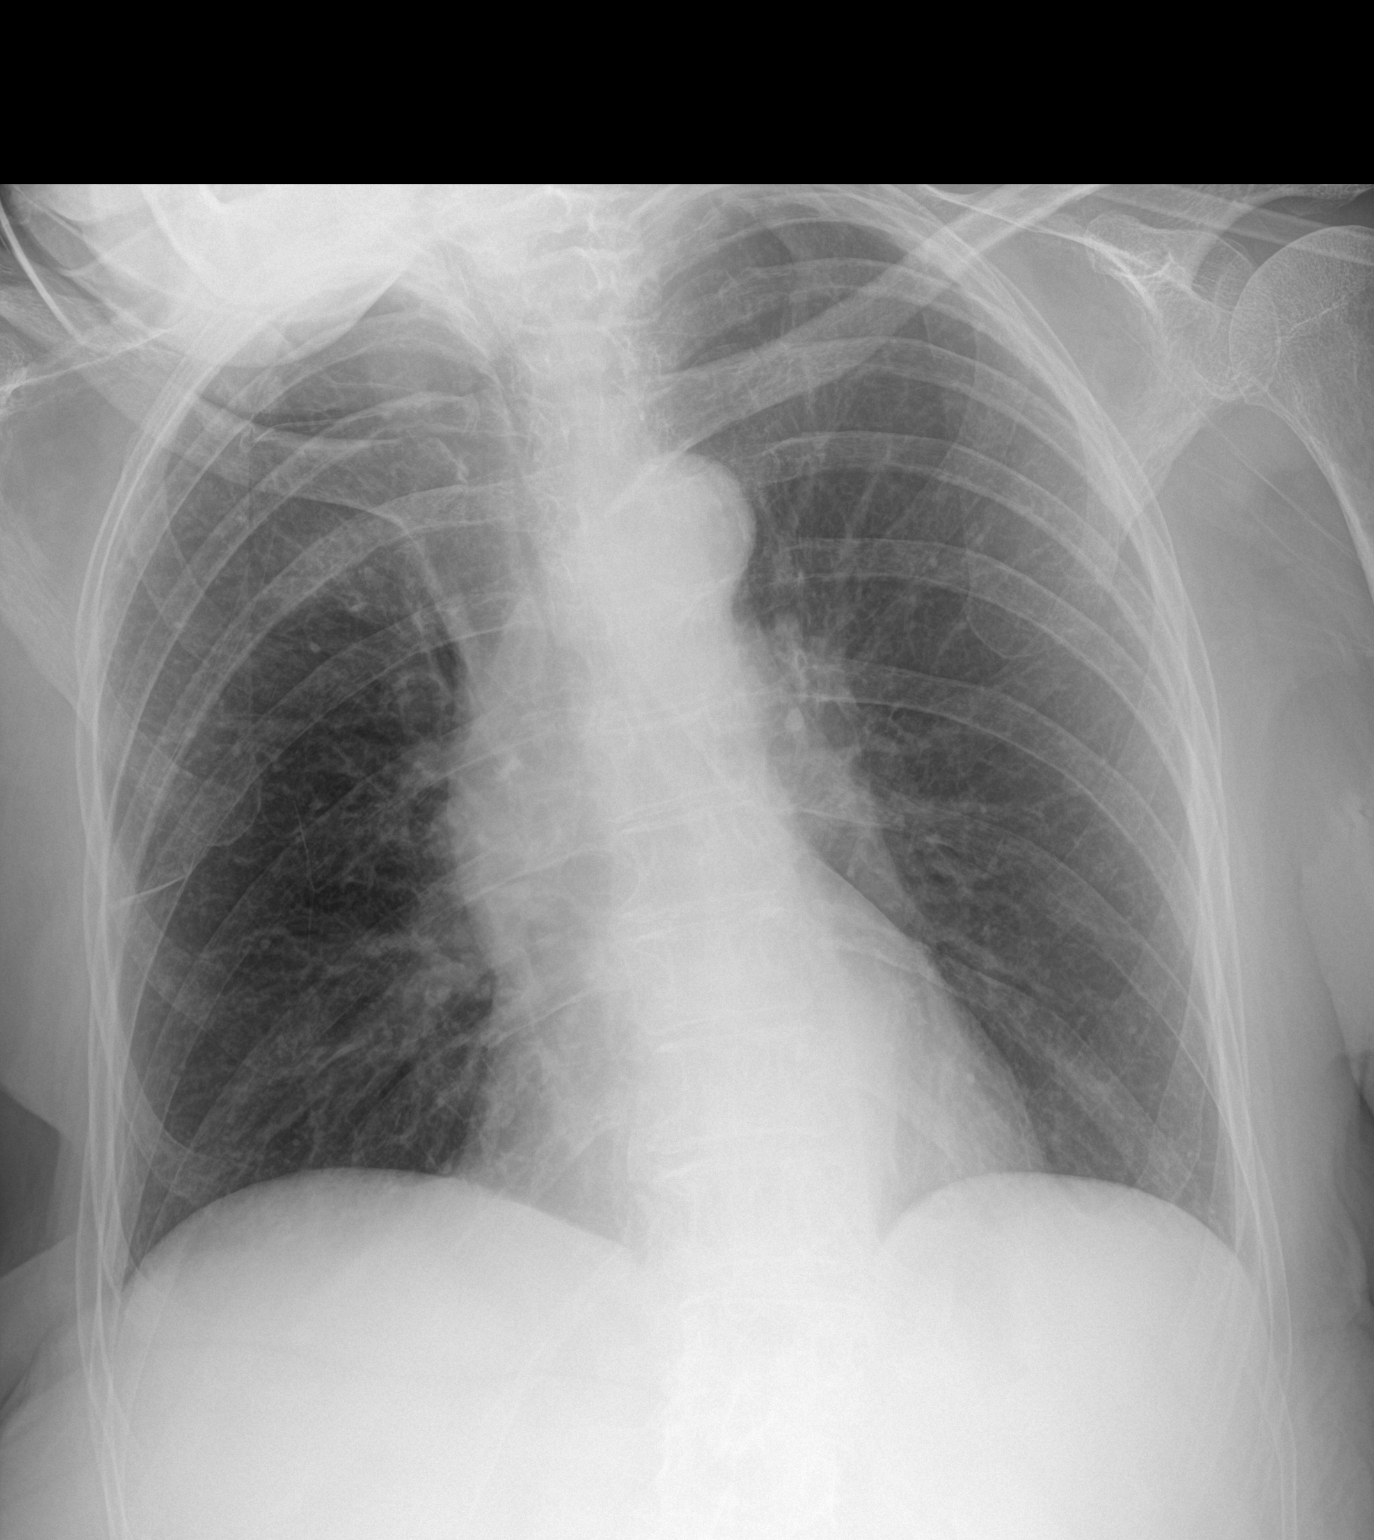
[im 2/2]
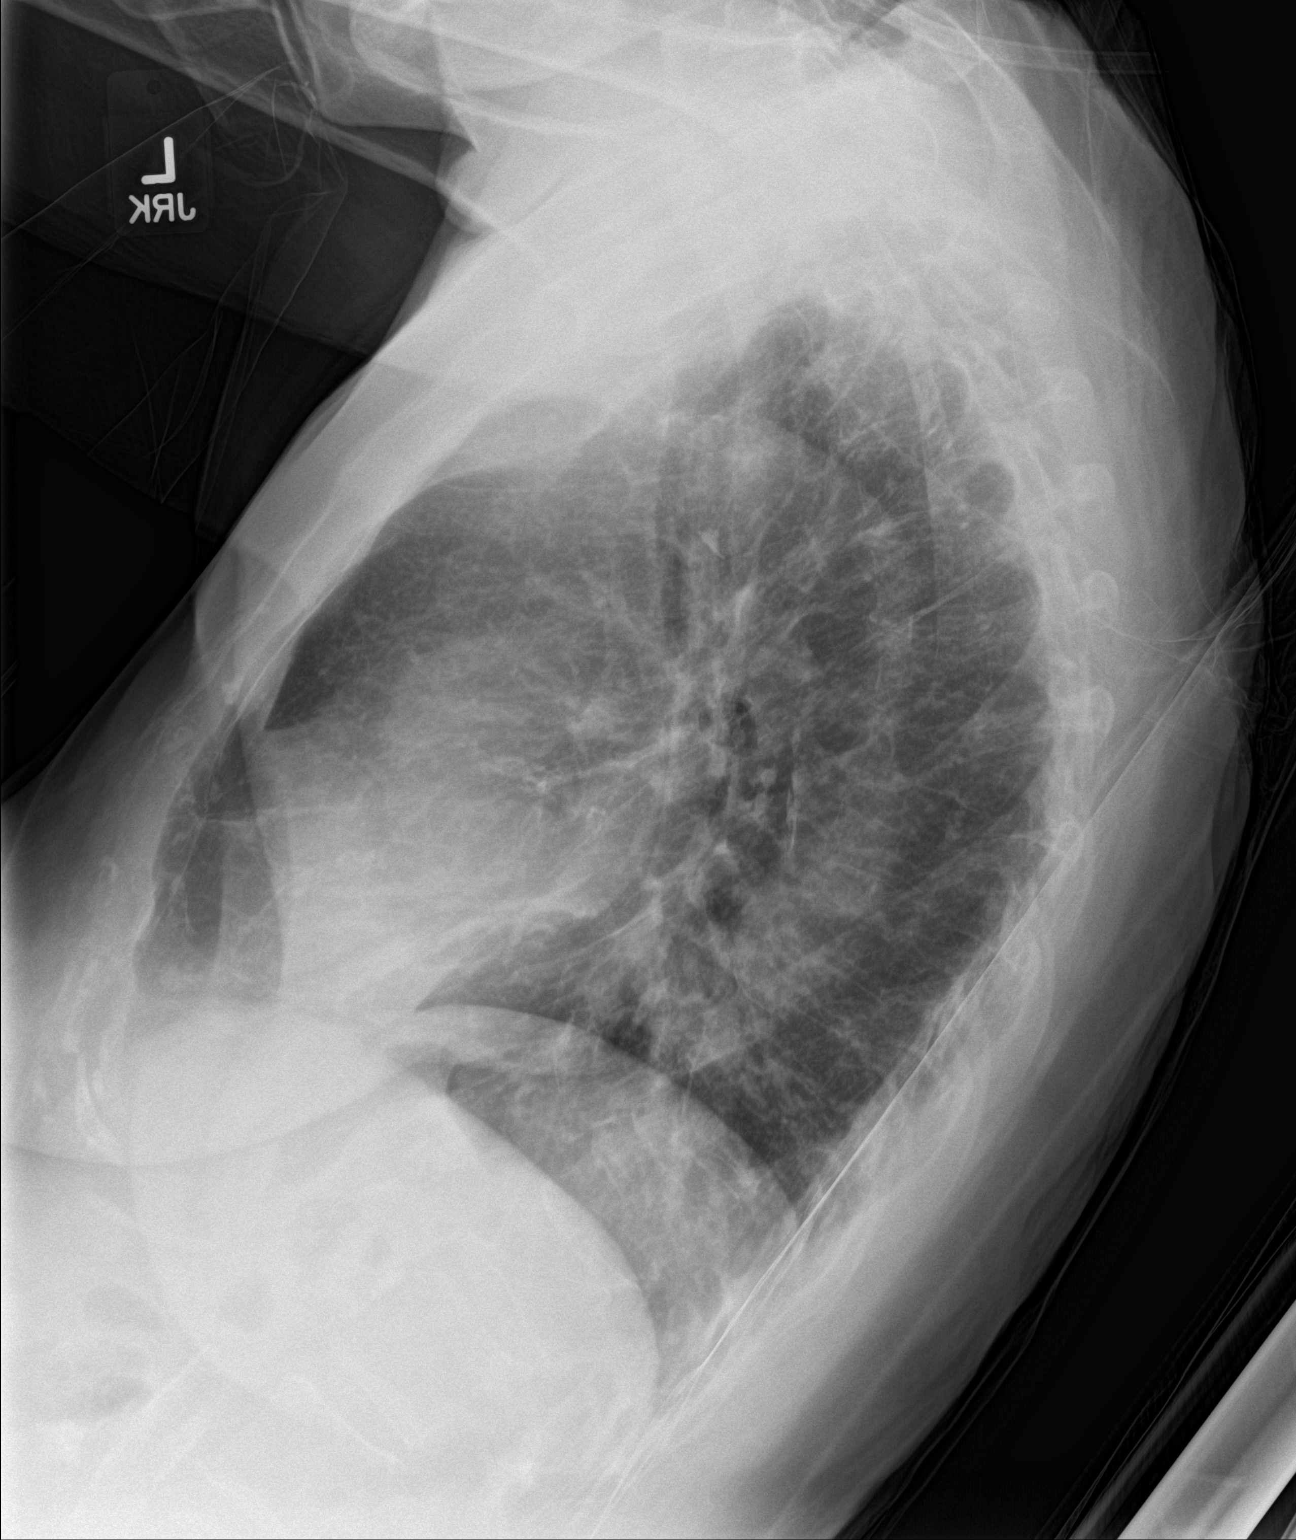

[2 of 2 positions shown; findings below may reference images not displayed]

FINDINGS: Normal heart size and pulmonary vascularity. No focal airspace
disease or consolidation in the lungs. No blunting of costophrenic
angles. No pneumothorax. Mediastinal contours appear intact.
Calcified and tortuous aorta. Multiple vertebral compression
deformities, likely due to osteoporosis. No change since prior
studies.
IMPRESSION: No active cardiopulmonary disease.
